# Patient Record
Sex: Male | Born: 1980 | Race: White | Hispanic: No | Marital: Married
Health system: Southern US, Community
[De-identification: ages and names within clinical notes are randomized; demographics above are authoritative.]

---

## 2004-10-08 ENCOUNTER — Emergency Department: Payer: Self-pay | Admitting: Internal Medicine

## 2005-05-30 ENCOUNTER — Other Ambulatory Visit: Payer: Self-pay

## 2005-05-30 ENCOUNTER — Emergency Department: Payer: Self-pay | Admitting: Emergency Medicine

## 2005-06-12 ENCOUNTER — Emergency Department: Payer: Self-pay | Admitting: Internal Medicine

## 2006-07-13 ENCOUNTER — Emergency Department: Payer: Self-pay | Admitting: Internal Medicine

## 2006-10-17 ENCOUNTER — Emergency Department: Payer: Self-pay | Admitting: Emergency Medicine

## 2006-10-22 ENCOUNTER — Emergency Department: Payer: Self-pay | Admitting: Emergency Medicine
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
## 2008-05-22 IMAGING — CR RIGHT FOOT COMPLETE - 3+ VIEW
1 series · 3 of 3 positions shown · non-contrast
Comparison: none

REASON FOR EXAM: SWOLLEN
COMMENTS:

PROCEDURE:     DXR - DXR FOOT RT COMPLETE W/OBLIQUES  - October 17, 2006 [DATE]
RESULT:     No acute bony or joint abnormalities identified.

[Series 1: view not recorded · 0.17mm/px · 3 of 3 slices shown]
[im 1/3]
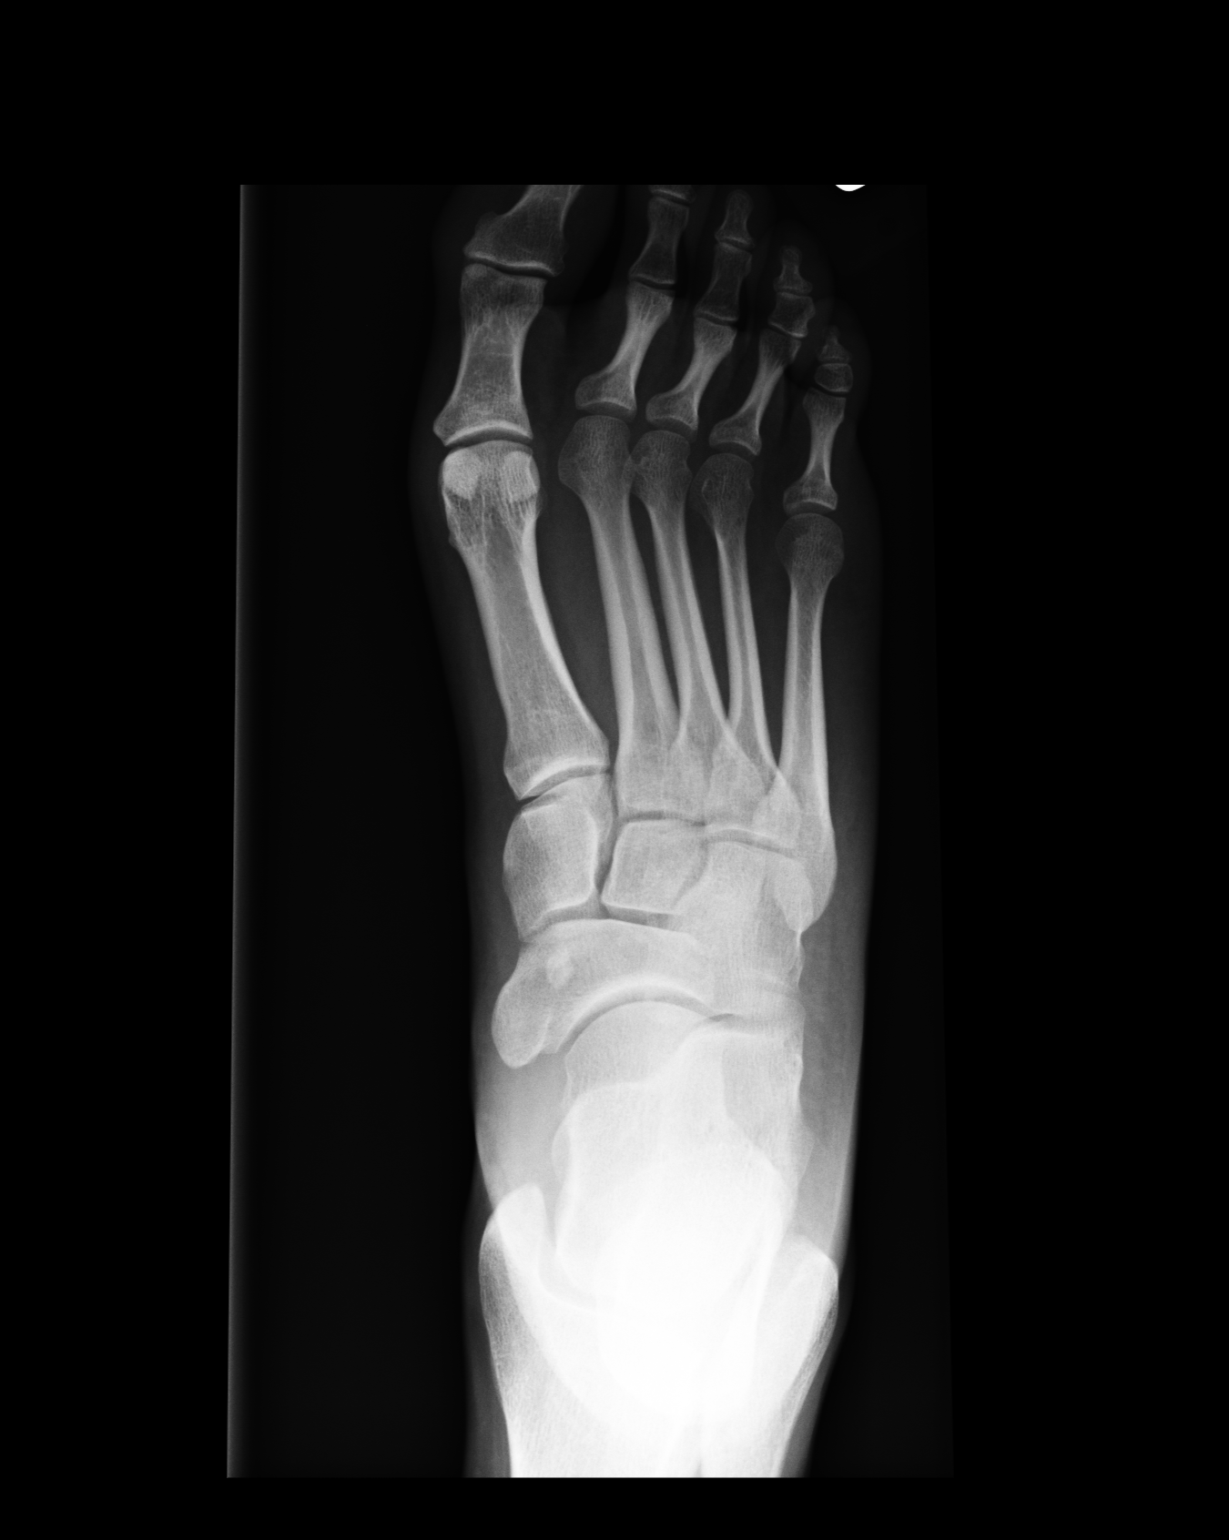
[im 2/3]
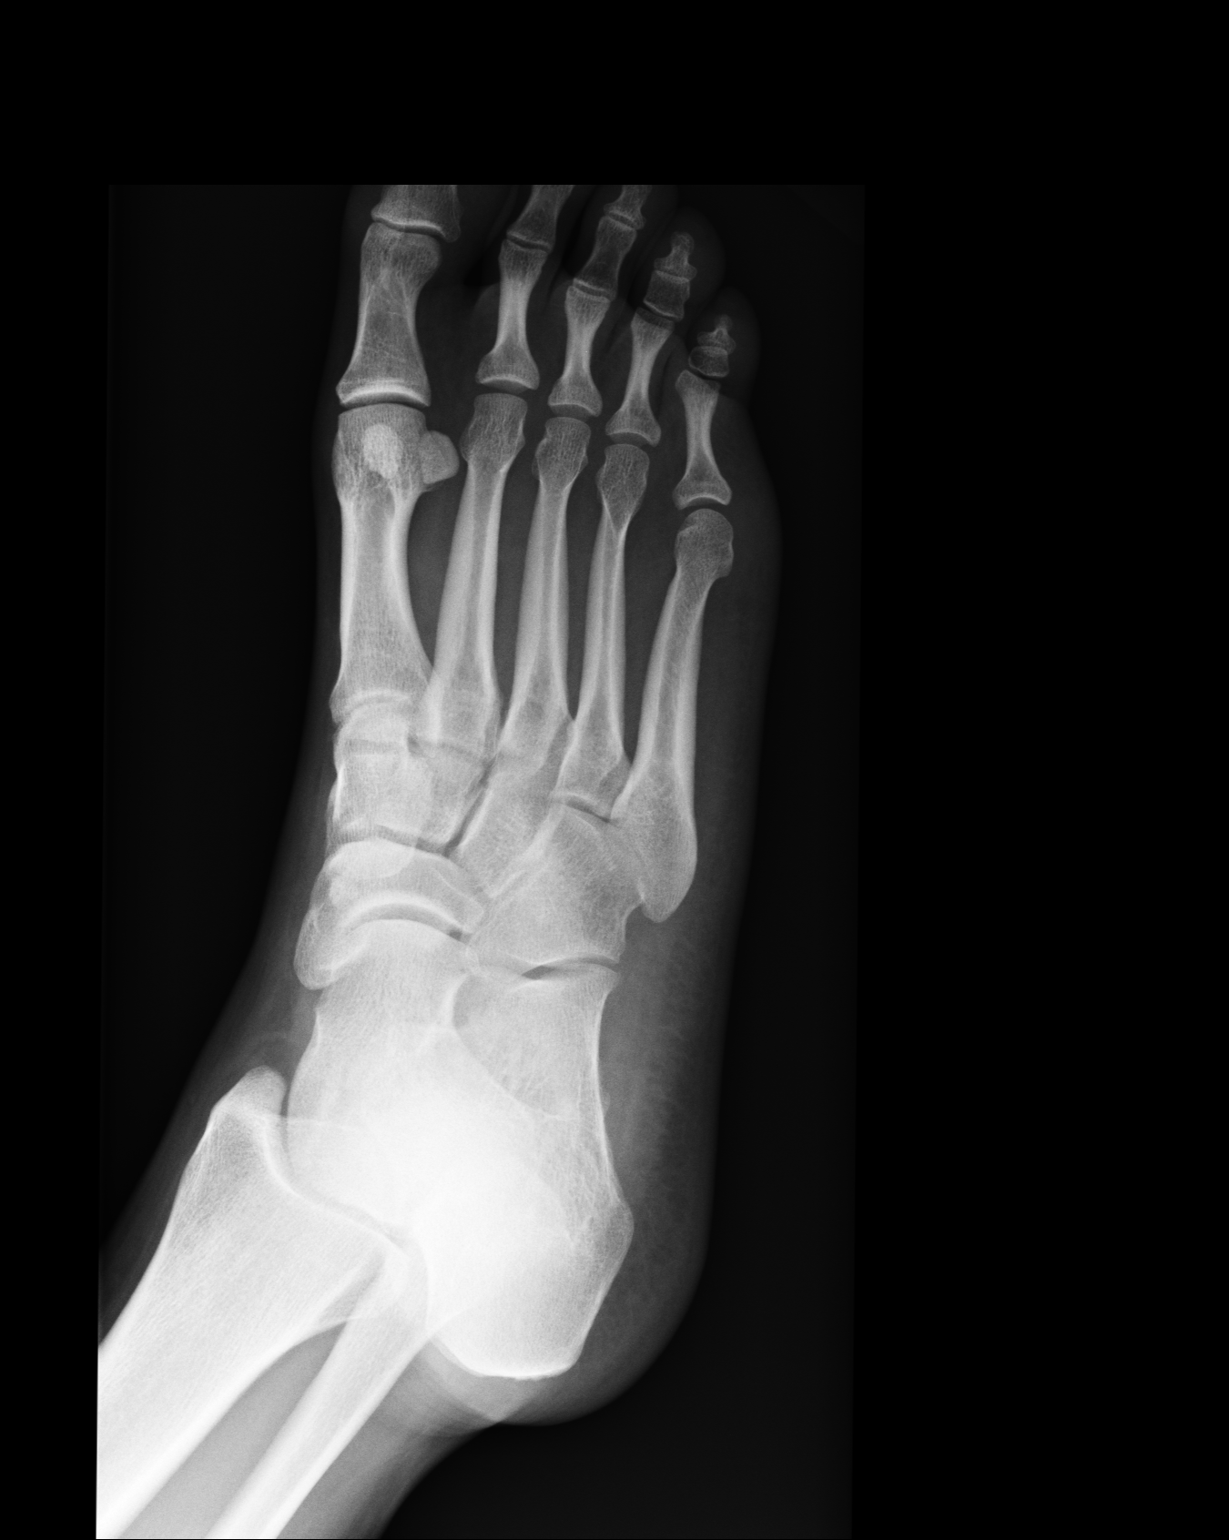
[im 3/3]
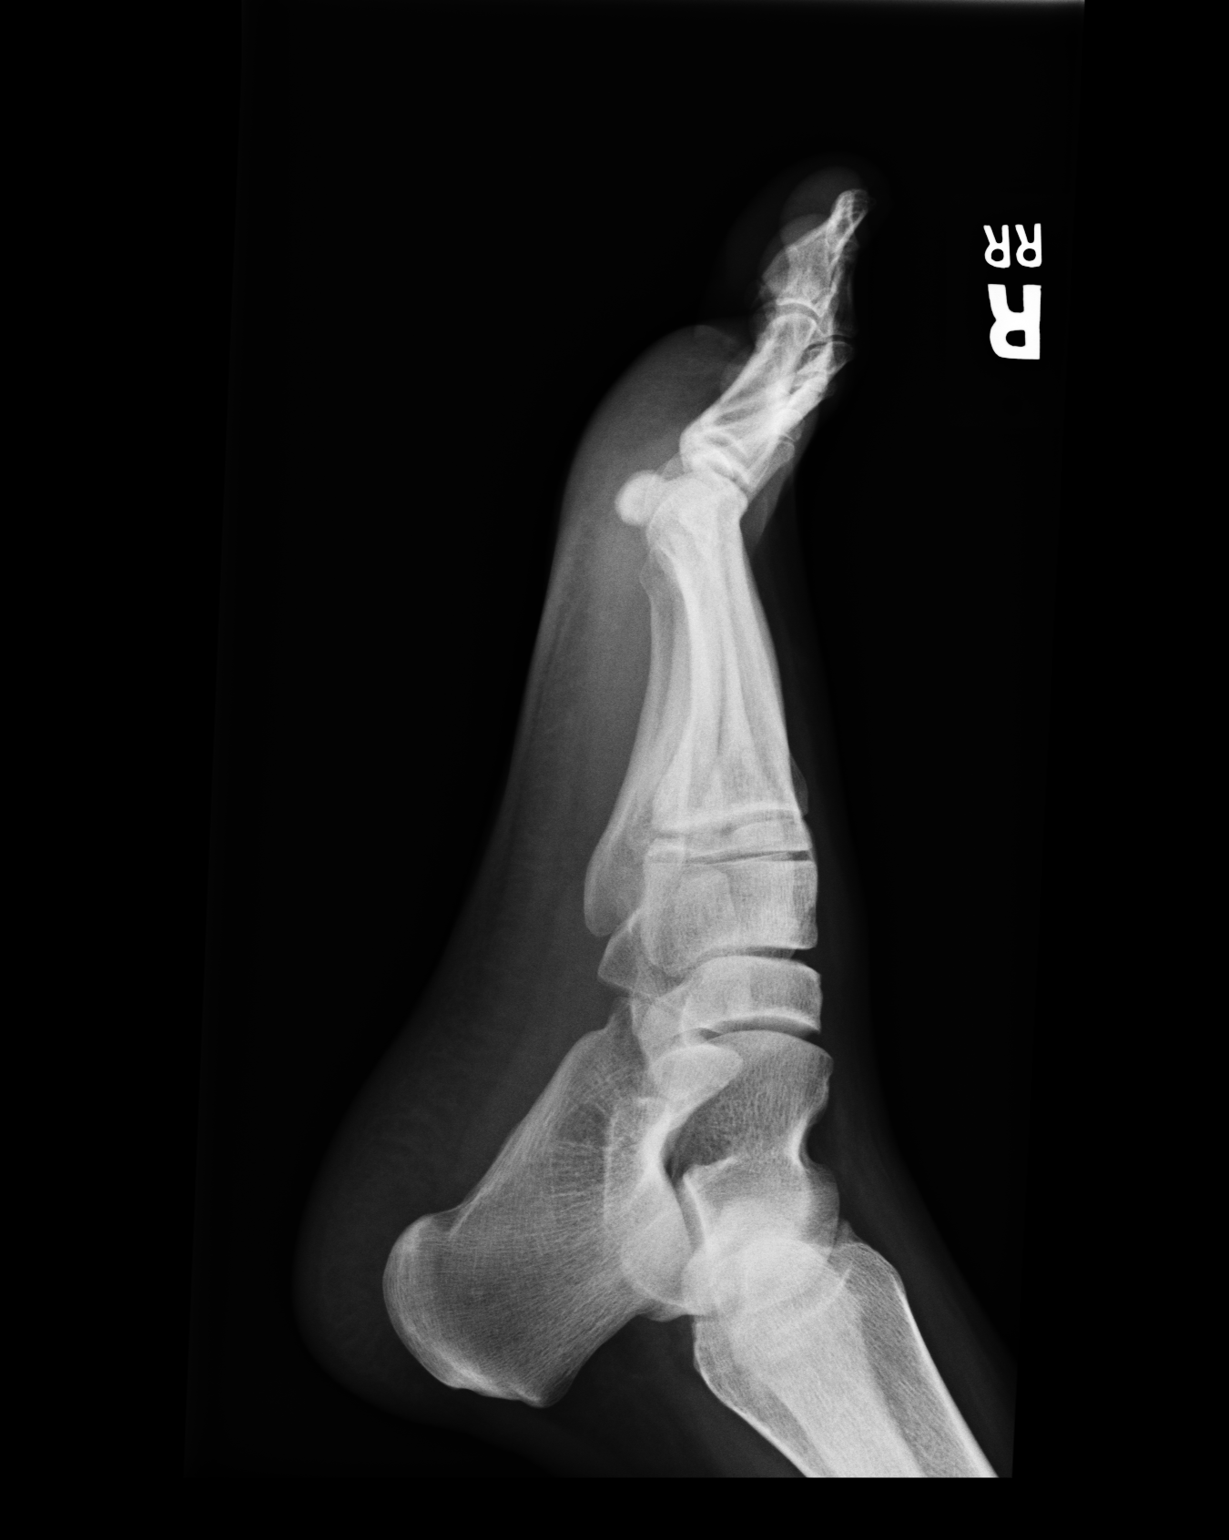

[3 of 3 positions shown; findings below may reference images not displayed]

IMPRESSION: 1)No acute abnormality.

## 2008-05-27 IMAGING — CR RIGHT FOOT COMPLETE - 3+ VIEW
1 series · 3 of 3 positions shown · non-contrast
Comparison: none

REASON FOR EXAM: swelling/ pain
COMMENTS:   LMP: (Male)

[Series 1: view not recorded · 0.17mm/px · 3 of 3 slices shown]
[im 1/3]
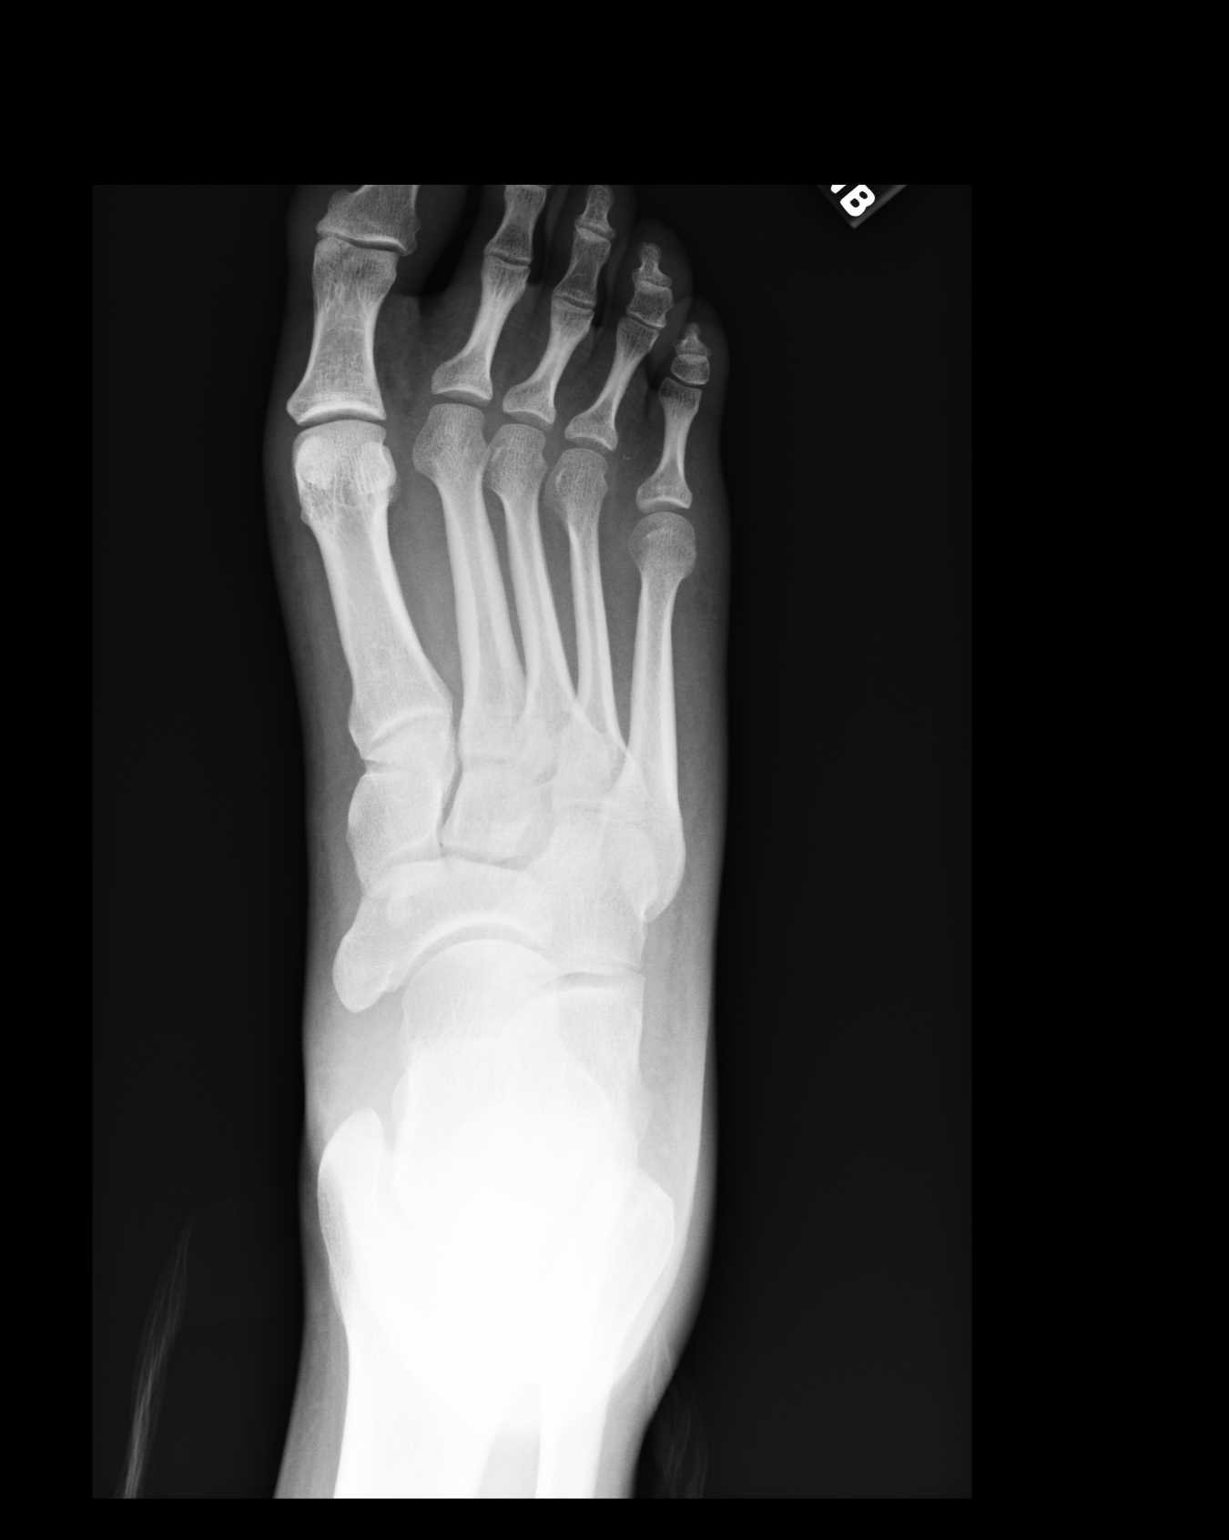
[im 2/3]
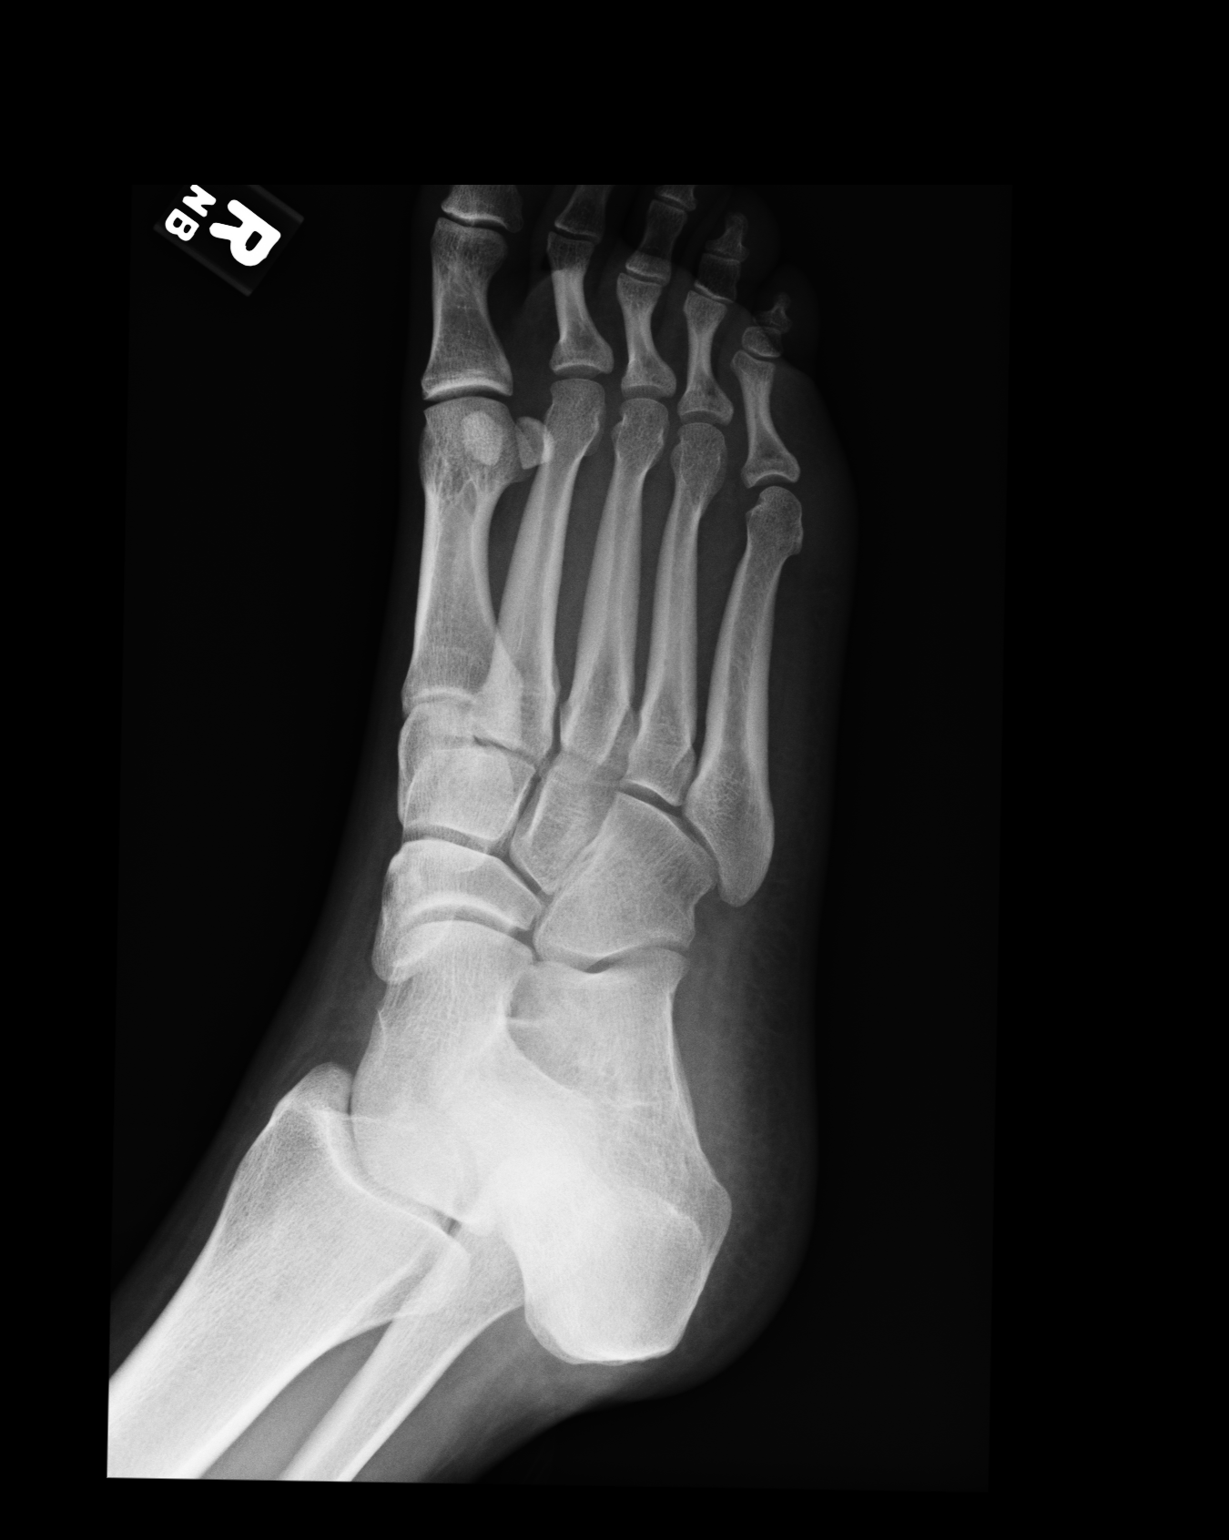
[im 3/3]
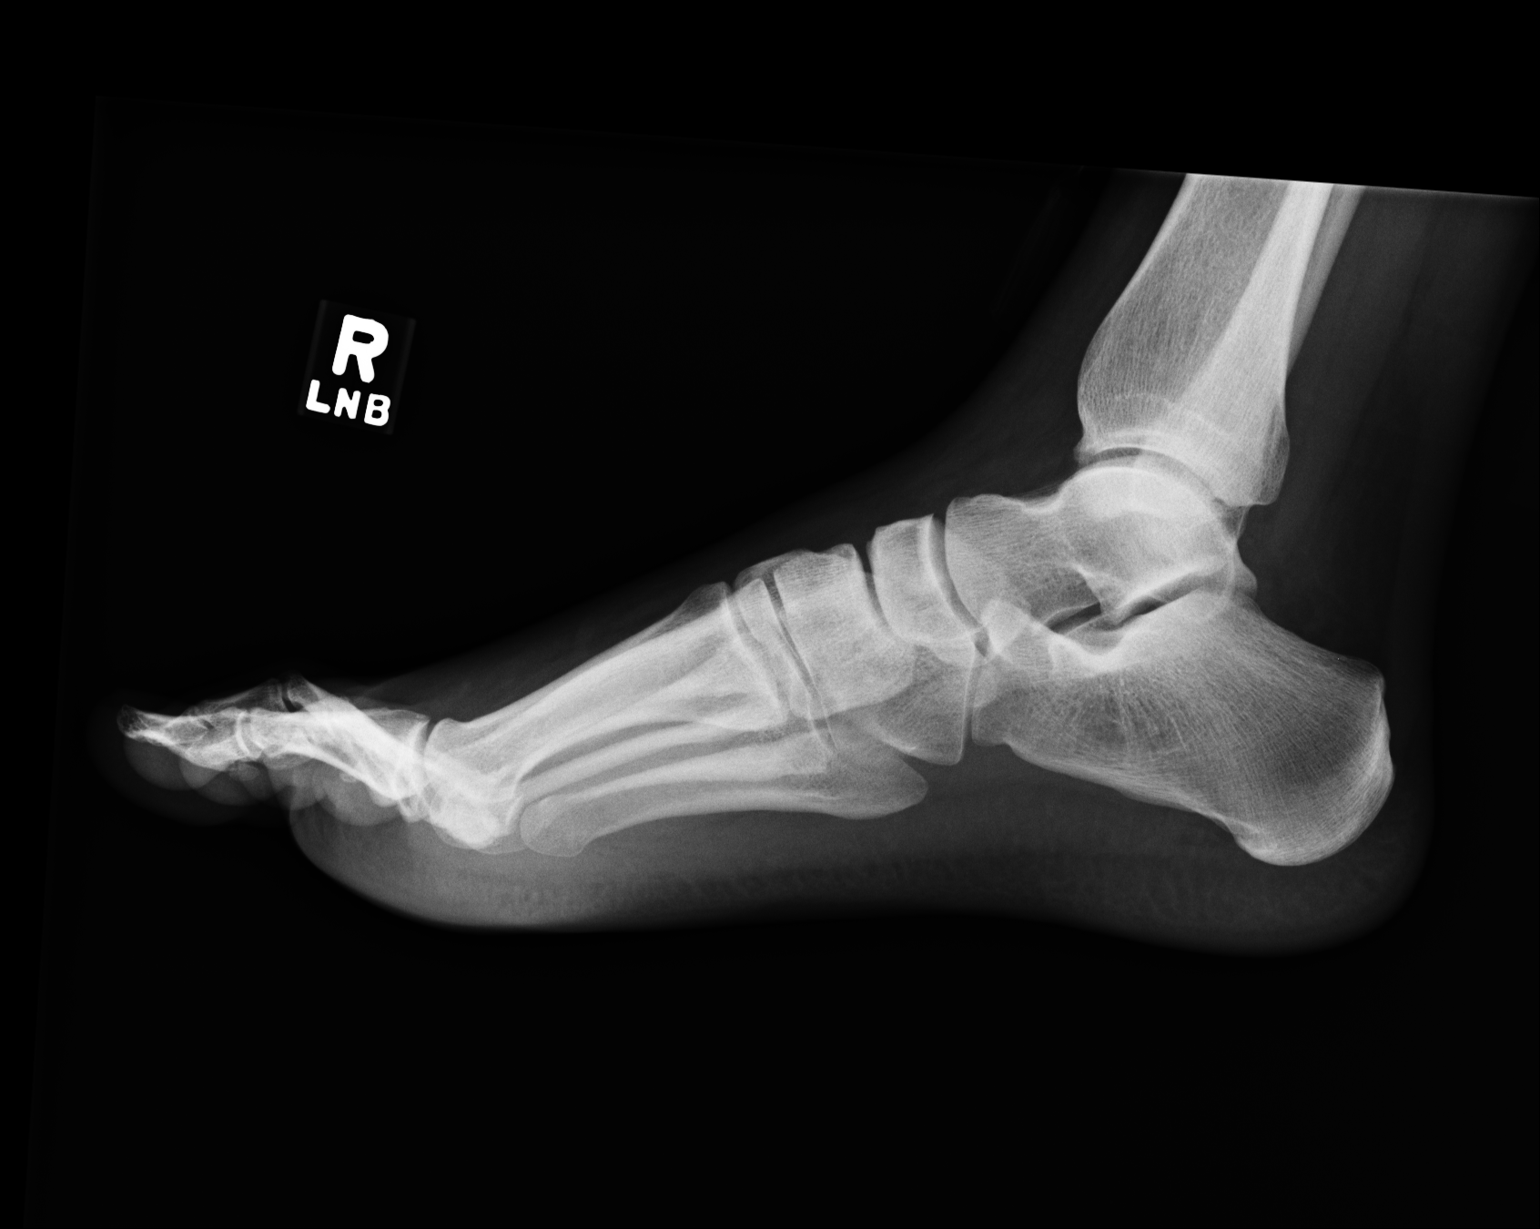

[3 of 3 positions shown; findings below may reference images not displayed]

PROCEDURE:     DXR - DXR FOOT RT COMPLETE W/OBLIQUES  - October 22, 2006  [DATE]

RESULT:     Images the RIGHT foot are compared to an exam of 10/19/06. There
is no evidence of fracture, dislocation or radiopaque foreign body. If the
patient has persistent symptoms then followup images could be obtained in 7
to 10 days to evaluate for an occult fracture.
IMPRESSION: No acute bony abnormality evident. No interval change evident. MRI could be
a useful next step if there is concern for ligamentous or tendinous injury
or possibly stress fracture.

## 2009-03-12 ENCOUNTER — Emergency Department: Payer: Self-pay | Admitting: Emergency Medicine

## 2009-11-04 ENCOUNTER — Emergency Department: Payer: Self-pay | Admitting: Unknown Physician Specialty

## 2016-06-08 ENCOUNTER — Emergency Department
Admission: EM | Admit: 2016-06-08 | Discharge: 2016-06-08 | Disposition: A | Discharge status: Home / Self Care | Payer: Managed Care, Other (non HMO) | Attending: Emergency Medicine | Admitting: Emergency Medicine

## 2016-06-08 DIAGNOSIS — K0889 Other specified disorders of teeth and supporting structures: Secondary | ICD-10-CM | POA: Diagnosis present

## 2016-06-08 DIAGNOSIS — K047 Periapical abscess without sinus: Secondary | ICD-10-CM | POA: Diagnosis not present

## 2016-06-08 LAB — COMPREHENSIVE METABOLIC PANEL
ALBUMIN: 4.3 g/dL (ref 3.5–5.0)
ALT: 14 U/L — ABNORMAL LOW (ref 17–63)
ANION GAP: 6 (ref 5–15)
AST: 23 U/L (ref 15–41)
Alkaline Phosphatase: 96 U/L (ref 38–126)
BUN: 10 mg/dL (ref 6–20)
CHLORIDE: 102 mmol/L (ref 101–111)
CO2: 28 mmol/L (ref 22–32)
Calcium: 8.9 mg/dL (ref 8.9–10.3)
Creatinine, Ser: 0.86 mg/dL (ref 0.61–1.24)
GFR calc Af Amer: >60 mL/min (ref >60)
GFR calc non Af Amer: >60 mL/min (ref >60)
GLUCOSE: 88 mg/dL (ref 65–99)
POTASSIUM: 3.5 mmol/L (ref 3.5–5.1)
SODIUM: 136 mmol/L (ref 135–145)
Total Bilirubin: 0.6 mg/dL (ref 0.3–1.2)
Total Protein: 7.4 g/dL (ref 6.5–8.1)

## 2016-06-08 LAB — CBC
HEMATOCRIT: 48 % (ref 40.0–52.0)
Hemoglobin: 16.6 g/dL (ref 13.0–18.0)
MCH: 31.3 pg (ref 26.0–34.0)
MCHC: 34.6 g/dL (ref 32.0–36.0)
MCV: 90.4 fL (ref 80.0–100.0)
PLATELETS: 290 10*3/uL (ref 150–440)
RBC: 5.31 MIL/uL (ref 4.40–5.90)
RDW: 13.6 % (ref 11.5–14.5)
WBC: 9.9 10*3/uL (ref 3.8–10.6)

## 2016-06-08 MED ORDER — CLINDAMYCIN HCL 300 MG PO CAPS: 300.0000 mg | ORAL_CAPSULE | Freq: Three times a day (TID) | ORAL | 0 refills | Status: AC

## 2016-06-08 MED ORDER — HYDROCODONE-ACETAMINOPHEN 5-325 MG PO TABS
1.0000 | ORAL_TABLET | Freq: Once | ORAL | Status: AC
  Administered 2016-06-08: 1 via ORAL
  Filled 2016-06-08: qty 1

## 2016-06-08 MED ORDER — TRAMADOL HCL 50 MG PO TABS: 50.0000 mg | ORAL_TABLET | Freq: Four times a day (QID) | ORAL | 0 refills | Status: AC | PRN

## 2016-06-08 MED ORDER — CLINDAMYCIN HCL 150 MG PO CAPS
300.0000 mg | ORAL_CAPSULE | Freq: Once | ORAL | Status: AC
  Administered 2016-06-08: 300 mg via ORAL
  Filled 2016-06-08: qty 2

## 2016-06-08 NOTE — ED Triage Notes (Signed)
Pt states he was having rt upper tooth pain, states that now he has swelling and pain to the rt side of his face

## 2016-06-08 NOTE — ED Provider Notes (Signed)
[facility] Emergency Department Provider Note  Time seen: 8:02 PM  I have reviewed the triage vital signs and the nursing notes.   HISTORY  Chief Complaint Oral Swelling    HPI Billy Santana is a 36 y.o. male With no past medical history who to the emergency department for right facial pain and swelli According to the patient for the past 3 or 4 days he has been experiencing pain in his rightupper premolar. He states over the past 2 days he has experienced swelling and pain to the right side of his face. Denies any fever or nausea or vomiting.currently appears well in no distress. States moderate discomfort mostly to the area of the tooth.  No past medical history on file.  There are no active problems to display for this patient.   No past surgical history on file.  Prior to Admission medications   Not on File    No Known Allergies  No family history on file.  Social History Social History  Substance Use Topics  . Smoking status: Not on file  . Smokeless tobacco: Not on file  . Alcohol use Not on file    Review of Systems Constitutional: Negative for fever. Cardiovascular: Negative for chest pain. Respiratory: Negative for shortness of breath. Gastrointestinal: Negative for abdominal pain Skin: no redness Neurological: Negative for headache 10-point ROS otherwise negative.  ____________________________________________   PHYSICAL EXAM:  VITAL SIGNS: ED Triage Vitals  Enc Vitals Group     BP 06/08/16 1816 130/77     Pulse Rate 06/08/16 1816 (!) 102     Resp 06/08/16 1816 18     Temp 06/08/16 1816 98.6 F (37 C)     Temp Source 06/08/16 1816 Oral     SpO2 06/08/16 1816 99 %     Weight --      Height --      Head Circumference --      Peak Flow --      Pain Score 06/08/16 1817 7     Pain Loc --      Pain Edu? --      Excl. in GC? --     Constitutional: Alert and oriented. Well appearing and in no distress. Eyes: Normal  exam ENT   Head: mild edema to right maxillary area, no erythema, mild tenderness to this area as well.   Mouth/Throat: Mucous membranes are moist.patient has poor dentition overall. He does have a fractured/decayed premolar, with tenderness in the gingiva but no signs of abscess. Cardiovascular: Normal rate, regular rhythm. No murmurs, rubs, or gallops. Respiratory: Normal respiratory effort without tachypnea nor retractions. Breath sounds are clear  Gastrointestinal: Soft and nontender. No distention. Musculoskeletal: Nontender with normal range of motion in all extremities Neurologic:  Normal speech and language. No gross focal neurologic deficits Skin:  Skin is warm, dry and intact.  Psychiatric: Mood and affect are normal.   ____________________________________________   INITIAL IMPRESSION / ASSESSMENT AND PLAN / ED COURSE  Pertinent labs & imaging results that were available during my care of the patient were reviewed by me and considered in my medical decision making (see chart for details).  patient presents to the emergency department with dental pain and facial swelling. No signs of abscesntition. Highly suspect likely odontogenic Root infection with mild possible cellulitis. We will place the patient on clindamycin and pain medication. We'll have the patient follow up with a dentist.  ____________________________________________   FINAL CLINICAL IMPRESSION(S) / ED  DIAGNOSES  dental infection   Minna AntisKevin Traivon Morrical, MD 06/08/16 2005

## 2016-06-08 NOTE — ED Notes (Signed)
NAD noted at time of D/C. Pt denies questions or concerns. Pt ambulatory to the lobby at this time.  

## 2016-06-08 NOTE — ED Notes (Signed)
Pt states took "leftover Augmentin yesterday".

## 2016-06-08 NOTE — Discharge Instructions (Signed)
OPTIONS FOR DENTAL FOLLOW UP CARE ° °[place] [facility] and [facility] - Local Safety Net Dental Clinics °http://www.ncdhhs.gov/dph/oralhealth/services/safetynetclinics.htm °  °[facility] (336-562-3123) ° °[facility] [place] (919-933-9087) ° °[facility] (919-663-1744 ext [phone]) ° °[facility] ([phone]) ° °[facility] ([phone]) °This clinic caters to the indigent population and is on a lottery system. °Location: °[facility], [address], [place] °Clinic Hours: °Wednesdays from 6pm - 9pm, patients seen by a lottery system. °For dates, call or go to www.med.[facility].edu/[facility]/patients/Dental-[facility] °Services: °Cleanings, fillings and simple extractions. °Payment Options: °DENTAL WORK IS FREE OF CHARGE. Bring proof of income or support. °Best way to get seen: °Arrive at 5:15 pm - this is a lottery, NOT first come/first serve, so arriving earlier will not increase your chances of being seen. °  °  °[facility] °[phone] °Select option 1 for emergencies °  °Location: °[facility], [address], [place] °Clinic Hours: °No walk-ins accepted - call the day before to schedule an appointment. °Check in times are 9:30 am and 1:30 pm. °Services: °Simple extractions, temporary fillings, pulpectomy/pulp debridement, uncomplicated abscess drainage. °Payment Options: °PAYMENT IS DUE AT THE TIME OF SERVICE.  Fee is usually $100-200, additional surgical procedures (e.g. abscess drainage) may be extra. °Cash, checks, Visa/MasterCard accepted.  Can file Medicaid if patient is covered for dental - patient should call case worker to check. °No discount for [facility] patients. °Best way to get seen: °MUST call the day before and get onto the schedule. Can usually be seen the next 1-2 days. No walk-ins accepted. °  °  °[facility] °[phone] °   °Location: °[facility], [address], [place] °Clinic Hours: °M, W, Th, F 8am or 1:30pm, Tues 9a or 1:30 - first come/first served. °Services: °Simple extractions, temporary fillings, uncomplicated abscess drainage.  You do not need to be an [place] resident. °Payment Options: °PAYMENT IS DUE AT THE TIME OF SERVICE. °Dental insurance, otherwise sliding scale - bring proof of income or support. °Depending on income and treatment needed, cost is usually $50-200. °Best way to get seen: °Arrive early as it is first come/first served. °  °  °[facility] °[phone] °  °Location: °[address] °Clinic Hours: °Mon-Thu 8a-5p °Services: °Most basic dental services including extractions and fillings. °Payment Options: °PAYMENT IS DUE AT THE TIME OF SERVICE. °Sliding scale, up to 50% off - bring proof if income or support. °Medicaid with dental option accepted. °Best way to get seen: °Call to schedule an appointment, can usually be seen within 2 weeks OR they will try to see walk-ins - show up at 8a or 2p (you may have to wait). °  °  °[facility] °919-245-2435 °[place] RESIDENTS ONLY °  °Location: °[facility], [address], [place], [place] [address] °Clinic Hours: By appointment only. °Monday - Thursday 8am-5pm, Friday 8am-12pm °Services: Cleanings, fillings, extractions. °Payment Options: °PAYMENT IS DUE AT THE TIME OF SERVICE. °Cash, Visa or MasterCard. Sliding scale - $30 minimum per service. °Best way to get seen: °Come in to office, complete packet and make an appointment - need proof of income °or support monies for each household member and proof of [place] residence. °Usually takes about a month to get in. °  °  °[facility] °[phone] °  °Location: °[address]   [place] °Clinic Hours: Walk-in Urgent Care Dental Services are offered Monday-Friday  mornings only. °The numbers of emergencies accepted daily is limited to the number of °providers available. °Maximum 15 - Mondays, Wednesdays & Thursdays °Maximum 10 - Tuesdays & Fridays °Services: °You do not need to be a [place] resident to be seen for a dental emergency. °Emergencies are defined as pain, swelling, abnormal bleeding, or dental trauma. Walkins will receive x-rays if needed. °NOTE: Dental cleaning is not an emergency. °Payment Options: °PAYMENT IS DUE AT THE TIME OF SERVICE. °Minimum co-pay is $40.00 for uninsured patients. °Minimum co-pay is $3.00 for Medicaid with dental coverage. °Dental Insurance is accepted and must be presented at time of visit. °Medicare does not cover dental. °Forms of payment: Cash, credit card, checks. °Best way to get seen: °If not previously registered with the clinic, walk-in dental registration begins at 7:15 am and is on a first come/first serve basis. °If previously registered with the clinic, call to make an appointment. °  °  °[facility] °919-776-4359 °[place] RESIDENTS ONLY °  °Location: °[address], [place], [place] °Clinic Hours: °Mon-Thu 10a-2p °Services: Extractions only! °Payment Options: °FREE (donations accepted) - bring proof of income or support °Best way to get seen: °Call and schedule an appointment OR come at 8am on the 1st Monday of every month (except for holidays) when it is first come/first served. °  °  °[facility] °[phone] °  °Location: °[address], [place] °Clinic Hours: °Friday mornings °Services, Payment Options, Best way to get seen: °Call for info °

## 2020-05-15 DIAGNOSIS — Z7251 High risk heterosexual behavior: Secondary | ICD-10-CM | POA: Diagnosis not present

## 2020-10-05 DIAGNOSIS — F4320 Adjustment disorder, unspecified: Secondary | ICD-10-CM | POA: Diagnosis not present

## 2020-10-12 DIAGNOSIS — F4320 Adjustment disorder, unspecified: Secondary | ICD-10-CM | POA: Diagnosis not present

## 2020-11-01 DIAGNOSIS — F4320 Adjustment disorder, unspecified: Secondary | ICD-10-CM | POA: Diagnosis not present

## 2020-11-08 DIAGNOSIS — F4320 Adjustment disorder, unspecified: Secondary | ICD-10-CM | POA: Diagnosis not present

## 2020-11-15 DIAGNOSIS — F4320 Adjustment disorder, unspecified: Secondary | ICD-10-CM | POA: Diagnosis not present

## 2020-11-21 DIAGNOSIS — F4320 Adjustment disorder, unspecified: Secondary | ICD-10-CM | POA: Diagnosis not present

## 2020-11-28 DIAGNOSIS — F4320 Adjustment disorder, unspecified: Secondary | ICD-10-CM | POA: Diagnosis not present

## 2020-12-05 DIAGNOSIS — F4320 Adjustment disorder, unspecified: Secondary | ICD-10-CM | POA: Diagnosis not present

## 2020-12-12 DIAGNOSIS — F4320 Adjustment disorder, unspecified: Secondary | ICD-10-CM | POA: Diagnosis not present

## 2020-12-19 DIAGNOSIS — F4320 Adjustment disorder, unspecified: Secondary | ICD-10-CM | POA: Diagnosis not present

## 2020-12-26 DIAGNOSIS — F4320 Adjustment disorder, unspecified: Secondary | ICD-10-CM | POA: Diagnosis not present

## 2021-01-02 DIAGNOSIS — F4320 Adjustment disorder, unspecified: Secondary | ICD-10-CM | POA: Diagnosis not present

## 2021-01-09 DIAGNOSIS — F4320 Adjustment disorder, unspecified: Secondary | ICD-10-CM | POA: Diagnosis not present

## 2021-01-16 DIAGNOSIS — F4320 Adjustment disorder, unspecified: Secondary | ICD-10-CM | POA: Diagnosis not present

## 2021-01-23 DIAGNOSIS — F4320 Adjustment disorder, unspecified: Secondary | ICD-10-CM | POA: Diagnosis not present

## 2021-01-30 DIAGNOSIS — F4320 Adjustment disorder, unspecified: Secondary | ICD-10-CM | POA: Diagnosis not present

## 2021-02-05 DIAGNOSIS — F4320 Adjustment disorder, unspecified: Secondary | ICD-10-CM | POA: Diagnosis not present

## 2021-02-06 DIAGNOSIS — F4320 Adjustment disorder, unspecified: Secondary | ICD-10-CM | POA: Diagnosis not present

## 2021-02-13 DIAGNOSIS — F4320 Adjustment disorder, unspecified: Secondary | ICD-10-CM | POA: Diagnosis not present

## 2021-02-20 DIAGNOSIS — F4320 Adjustment disorder, unspecified: Secondary | ICD-10-CM | POA: Diagnosis not present

## 2021-02-22 DIAGNOSIS — F4323 Adjustment disorder with mixed anxiety and depressed mood: Secondary | ICD-10-CM | POA: Diagnosis not present

## 2021-02-27 DIAGNOSIS — F4323 Adjustment disorder with mixed anxiety and depressed mood: Secondary | ICD-10-CM | POA: Diagnosis not present

## 2021-03-04 DIAGNOSIS — F4323 Adjustment disorder with mixed anxiety and depressed mood: Secondary | ICD-10-CM | POA: Diagnosis not present

## 2021-03-12 DIAGNOSIS — F4323 Adjustment disorder with mixed anxiety and depressed mood: Secondary | ICD-10-CM | POA: Diagnosis not present

## 2021-03-18 ENCOUNTER — Telehealth: Payer: BC Managed Care – PPO | Admitting: Nurse Practitioner

## 2021-03-18 DIAGNOSIS — K047 Periapical abscess without sinus: Secondary | ICD-10-CM

## 2021-03-18 MED ORDER — AMOXICILLIN 500 MG PO CAPS: 500.0000 mg | ORAL_CAPSULE | Freq: Three times a day (TID) | ORAL | 0 refills | Status: AC

## 2021-03-18 NOTE — Progress Notes (Signed)
Virtual Visit Consent   Billy Santana, you are scheduled for a virtual visit with a [facility] provider today.     Just as with appointments in the office, your consent must be obtained to participate.  Your consent will be active for this visit and any virtual visit you may have with one of our providers in the next 365 days.     If you have a MyChart account, a copy of this consent can be sent to you electronically.  All virtual visits are billed to your insurance company just like a traditional visit in the office.    As this is a virtual visit, video technology does not allow for your provider to perform a traditional examination.  This may limit your provider's ability to fully assess your condition.  If your provider identifies any concerns that need to be evaluated in person or the need to arrange testing (such as labs, EKG, etc.), we will make arrangements to do so.     Although advances in technology are sophisticated, we cannot ensure that it will always work on either your end or our end.  If the connection with a video visit is poor, the visit may have to be switched to a telephone visit.  With either a video or telephone visit, we are not always able to ensure that we have a secure connection.     I need to obtain your verbal consent now.   Are you willing to proceed with your visit today?    Billy Santana has provided verbal consent on 03/18/2021 for a virtual visit (video or telephone).   Viviano Simas, FNP   Date: 03/18/2021 4:59 PM   Virtual Visit via Video Note   I, Viviano Simas, connected with  Billy Santana  ([id number], 09-25-1980) on 03/18/21 at  5:00 PM EST by a video-enabled telemedicine application and verified that I am speaking with the correct person using two identifiers.  Location: Patient: Virtual Visit Location Patient: Home Provider: Virtual Visit Location Provider: Home Office   I discussed the limitations of evaluation and management by telemedicine and the  availability of in person appointments. The patient expressed understanding and agreed to proceed.    History of Present Illness: Billy Santana is a 40 y.o. who identifies as a male who was assigned male at birth, and is being seen today with tooth pain on bottom right. He knows that he has a bad tooth and is waiting to be seen by his dentist for removal   The area is warm to touch but he denies a fever.   No Known Allergies   Observations/Objective: Patient is well-developed, well-nourished in no acute distress.  Resting comfortably  at home.  Head is normocephalic, atraumatic.  No labored breathing.  Speech is clear and coherent with logical content.  Patient is alert and oriented at baseline.    Assessment and Plan: 1. Tooth abscess  - amoxicillin (AMOXIL) 500 MG capsule; Take 1 capsule (500 mg total) by mouth 3 (three) times daily for 10 days.  Dispense: 30 capsule; Refill: 0    Take ibuprofen up to 600mg  every 6 hours as needed for pain relief. Take with food   Call dentist for urgent appointment as discussed   Follow Up Instructions: I discussed the assessment and treatment plan with the patient. The patient was provided an opportunity to ask questions and all were answered. The patient agreed with the plan and demonstrated an understanding of the  instructions.  A copy of instructions were sent to the patient via MyChart unless otherwise noted below.     The patient was advised to call back or seek an in-person evaluation if the symptoms worsen or if the condition fails to improve as anticipated.  Time:  I spent 10 minutes with the patient via telehealth technology discussing the above problems/concerns.    Viviano Simas, FNP

## 2021-03-20 DIAGNOSIS — F4323 Adjustment disorder with mixed anxiety and depressed mood: Secondary | ICD-10-CM | POA: Diagnosis not present

## 2021-03-27 DIAGNOSIS — F4323 Adjustment disorder with mixed anxiety and depressed mood: Secondary | ICD-10-CM | POA: Diagnosis not present

## 2021-04-03 DIAGNOSIS — F4323 Adjustment disorder with mixed anxiety and depressed mood: Secondary | ICD-10-CM | POA: Diagnosis not present

## 2021-04-10 DIAGNOSIS — F4323 Adjustment disorder with mixed anxiety and depressed mood: Secondary | ICD-10-CM | POA: Diagnosis not present

## 2021-04-17 DIAGNOSIS — F4323 Adjustment disorder with mixed anxiety and depressed mood: Secondary | ICD-10-CM | POA: Diagnosis not present

## 2021-04-24 DIAGNOSIS — F4323 Adjustment disorder with mixed anxiety and depressed mood: Secondary | ICD-10-CM | POA: Diagnosis not present

## 2021-05-01 DIAGNOSIS — F4323 Adjustment disorder with mixed anxiety and depressed mood: Secondary | ICD-10-CM | POA: Diagnosis not present

## 2021-05-08 DIAGNOSIS — F4323 Adjustment disorder with mixed anxiety and depressed mood: Secondary | ICD-10-CM | POA: Diagnosis not present

## 2021-05-15 DIAGNOSIS — F4323 Adjustment disorder with mixed anxiety and depressed mood: Secondary | ICD-10-CM | POA: Diagnosis not present

## 2021-05-22 DIAGNOSIS — F4323 Adjustment disorder with mixed anxiety and depressed mood: Secondary | ICD-10-CM | POA: Diagnosis not present

## 2021-05-29 DIAGNOSIS — F4323 Adjustment disorder with mixed anxiety and depressed mood: Secondary | ICD-10-CM | POA: Diagnosis not present

## 2021-06-05 DIAGNOSIS — F4323 Adjustment disorder with mixed anxiety and depressed mood: Secondary | ICD-10-CM | POA: Diagnosis not present

## 2021-06-11 DIAGNOSIS — F4323 Adjustment disorder with mixed anxiety and depressed mood: Secondary | ICD-10-CM | POA: Diagnosis not present

## 2021-06-19 DIAGNOSIS — F4323 Adjustment disorder with mixed anxiety and depressed mood: Secondary | ICD-10-CM | POA: Diagnosis not present

## 2021-06-26 DIAGNOSIS — F4323 Adjustment disorder with mixed anxiety and depressed mood: Secondary | ICD-10-CM | POA: Diagnosis not present

## 2021-07-03 DIAGNOSIS — F4323 Adjustment disorder with mixed anxiety and depressed mood: Secondary | ICD-10-CM | POA: Diagnosis not present

## 2021-07-10 DIAGNOSIS — F4323 Adjustment disorder with mixed anxiety and depressed mood: Secondary | ICD-10-CM | POA: Diagnosis not present

## 2021-07-17 DIAGNOSIS — F4323 Adjustment disorder with mixed anxiety and depressed mood: Secondary | ICD-10-CM | POA: Diagnosis not present

## 2021-07-24 DIAGNOSIS — F4323 Adjustment disorder with mixed anxiety and depressed mood: Secondary | ICD-10-CM | POA: Diagnosis not present

## 2021-07-31 DIAGNOSIS — F4323 Adjustment disorder with mixed anxiety and depressed mood: Secondary | ICD-10-CM | POA: Diagnosis not present

## 2021-08-07 DIAGNOSIS — F4323 Adjustment disorder with mixed anxiety and depressed mood: Secondary | ICD-10-CM | POA: Diagnosis not present

## 2021-08-14 DIAGNOSIS — F4323 Adjustment disorder with mixed anxiety and depressed mood: Secondary | ICD-10-CM | POA: Diagnosis not present

## 2021-08-28 DIAGNOSIS — F4323 Adjustment disorder with mixed anxiety and depressed mood: Secondary | ICD-10-CM | POA: Diagnosis not present

## 2021-09-11 DIAGNOSIS — F4323 Adjustment disorder with mixed anxiety and depressed mood: Secondary | ICD-10-CM | POA: Diagnosis not present

## 2021-09-25 DIAGNOSIS — F4323 Adjustment disorder with mixed anxiety and depressed mood: Secondary | ICD-10-CM | POA: Diagnosis not present

## 2021-10-09 DIAGNOSIS — F4323 Adjustment disorder with mixed anxiety and depressed mood: Secondary | ICD-10-CM | POA: Diagnosis not present

## 2021-10-23 DIAGNOSIS — F4323 Adjustment disorder with mixed anxiety and depressed mood: Secondary | ICD-10-CM | POA: Diagnosis not present

## 2021-11-20 DIAGNOSIS — F4323 Adjustment disorder with mixed anxiety and depressed mood: Secondary | ICD-10-CM | POA: Diagnosis not present

## 2021-12-18 DIAGNOSIS — F4323 Adjustment disorder with mixed anxiety and depressed mood: Secondary | ICD-10-CM | POA: Diagnosis not present

## 2022-03-28 DIAGNOSIS — Z720 Tobacco use: Secondary | ICD-10-CM | POA: Diagnosis not present

## 2022-03-28 DIAGNOSIS — Z114 Encounter for screening for human immunodeficiency virus [HIV]: Secondary | ICD-10-CM | POA: Diagnosis not present

## 2022-03-28 DIAGNOSIS — Z1159 Encounter for screening for other viral diseases: Secondary | ICD-10-CM | POA: Diagnosis not present

## 2022-03-28 DIAGNOSIS — R5383 Other fatigue: Secondary | ICD-10-CM | POA: Diagnosis not present

## 2022-03-28 DIAGNOSIS — R739 Hyperglycemia, unspecified: Secondary | ICD-10-CM | POA: Diagnosis not present

## 2022-03-28 DIAGNOSIS — Z130 Encounter for screening for diseases of the blood and blood-forming organs and certain disorders involving the immune mechanism: Secondary | ICD-10-CM | POA: Diagnosis not present

## 2022-03-28 DIAGNOSIS — Z13220 Encounter for screening for lipoid disorders: Secondary | ICD-10-CM | POA: Diagnosis not present

## 2022-03-28 DIAGNOSIS — I252 Old myocardial infarction: Secondary | ICD-10-CM | POA: Diagnosis not present

## 2022-04-23 DIAGNOSIS — F4323 Adjustment disorder with mixed anxiety and depressed mood: Secondary | ICD-10-CM | POA: Diagnosis not present

## 2022-05-21 DIAGNOSIS — F4323 Adjustment disorder with mixed anxiety and depressed mood: Secondary | ICD-10-CM | POA: Diagnosis not present

## 2022-06-04 DIAGNOSIS — F4323 Adjustment disorder with mixed anxiety and depressed mood: Secondary | ICD-10-CM | POA: Diagnosis not present

## 2022-06-18 DIAGNOSIS — F4323 Adjustment disorder with mixed anxiety and depressed mood: Secondary | ICD-10-CM | POA: Diagnosis not present

## 2022-07-02 DIAGNOSIS — F4323 Adjustment disorder with mixed anxiety and depressed mood: Secondary | ICD-10-CM | POA: Diagnosis not present

## 2022-07-16 DIAGNOSIS — F4323 Adjustment disorder with mixed anxiety and depressed mood: Secondary | ICD-10-CM | POA: Diagnosis not present

## 2022-07-30 DIAGNOSIS — F4323 Adjustment disorder with mixed anxiety and depressed mood: Secondary | ICD-10-CM | POA: Diagnosis not present

## 2022-08-13 DIAGNOSIS — F4323 Adjustment disorder with mixed anxiety and depressed mood: Secondary | ICD-10-CM | POA: Diagnosis not present

## 2022-09-10 DIAGNOSIS — F4323 Adjustment disorder with mixed anxiety and depressed mood: Secondary | ICD-10-CM | POA: Diagnosis not present

## 2022-10-08 DIAGNOSIS — F4323 Adjustment disorder with mixed anxiety and depressed mood: Secondary | ICD-10-CM | POA: Diagnosis not present

## 2022-11-05 DIAGNOSIS — F4323 Adjustment disorder with mixed anxiety and depressed mood: Secondary | ICD-10-CM | POA: Diagnosis not present

## 2023-01-08 DIAGNOSIS — F4323 Adjustment disorder with mixed anxiety and depressed mood: Secondary | ICD-10-CM | POA: Diagnosis not present

## 2023-02-11 DIAGNOSIS — F4323 Adjustment disorder with mixed anxiety and depressed mood: Secondary | ICD-10-CM | POA: Diagnosis not present
